# Patient Record
Sex: Male | Born: 1990 | Race: Black or African American | Hispanic: No | Marital: Single | Smoking: Current every day smoker
Health system: Southern US, Community
[De-identification: ages and names within clinical notes are randomized; demographics above are authoritative.]

---

## 2019-04-12 ENCOUNTER — Other Ambulatory Visit: Payer: Self-pay | Admitting: *Deleted

## 2019-04-12 DIAGNOSIS — Z20822 Contact with and (suspected) exposure to covid-19: Secondary | ICD-10-CM

## 2019-04-14 LAB — NOVEL CORONAVIRUS, NAA: SARS-CoV-2, NAA: DETECTED — AB

## 2020-05-19 ENCOUNTER — Emergency Department: Admission: EM | Admit: 2020-05-19 | Discharge: 2020-05-19 | Discharge status: Against Medical Advice | Payer: Self-pay

## 2020-05-19 NOTE — ED Notes (Signed)
No answer when called from lobby 

## 2020-07-21 ENCOUNTER — Encounter: Payer: Self-pay | Admitting: Emergency Medicine

## 2020-07-21 ENCOUNTER — Emergency Department
Admission: EM | Admit: 2020-07-21 | Discharge: 2020-07-21 | Disposition: A | Discharge status: Home / Self Care | Payer: Self-pay | Attending: Emergency Medicine | Admitting: Emergency Medicine

## 2020-07-21 ENCOUNTER — Other Ambulatory Visit: Payer: Self-pay

## 2020-07-21 ENCOUNTER — Emergency Department: Payer: Self-pay

## 2020-07-21 DIAGNOSIS — S6010XA Contusion of unspecified finger with damage to nail, initial encounter: Secondary | ICD-10-CM

## 2020-07-21 DIAGNOSIS — S62660A Nondisplaced fracture of distal phalanx of right index finger, initial encounter for closed fracture: Secondary | ICD-10-CM | POA: Insufficient documentation

## 2020-07-21 DIAGNOSIS — W231XXA Caught, crushed, jammed, or pinched between stationary objects, initial encounter: Secondary | ICD-10-CM | POA: Insufficient documentation

## 2020-07-21 DIAGNOSIS — F17210 Nicotine dependence, cigarettes, uncomplicated: Secondary | ICD-10-CM | POA: Insufficient documentation

## 2020-07-21 DIAGNOSIS — Z23 Encounter for immunization: Secondary | ICD-10-CM | POA: Insufficient documentation

## 2020-07-21 MED ORDER — TRAMADOL HCL 50 MG PO TABS
50.0000 mg | ORAL_TABLET | Freq: Once | ORAL | Status: AC
  Administered 2020-07-21: 50 mg via ORAL
  Filled 2020-07-21: qty 1

## 2020-07-21 MED ORDER — TRAMADOL HCL 50 MG PO TABS: 50.0000 mg | ORAL_TABLET | Freq: Four times a day (QID) | ORAL | 0 refills | Status: AC | PRN

## 2020-07-21 MED ORDER — TETANUS-DIPHTH-ACELL PERTUSSIS 5-2.5-18.5 LF-MCG/0.5 IM SUSY
0.5000 mL | PREFILLED_SYRINGE | Freq: Once | INTRAMUSCULAR | Status: AC
  Administered 2020-07-21: 0.5 mL via INTRAMUSCULAR
  Filled 2020-07-21: qty 0.5

## 2020-07-21 NOTE — Discharge Instructions (Addendum)
Ice and elevate as needed for pain and swelling.  Wear the finger splint for support and protection.  You may take it off to take shower and then replace it.  A prescription of tramadol was sent to your pharmacy.  This medication is for pain.  Do not drive and operate machinery while taking this medication.  Call and make an appointment with Dr. Odis Luster who is the orthopedist on-call for follow-up.

## 2020-07-21 NOTE — ED Provider Notes (Signed)
[facility] Emergency Department Provider Note  ____________________________________________   Event Date/Time   First MD Initiated Contact with Patient 07/21/20 [id number]     (approximate)  I have reviewed the triage vital signs and the nursing notes.   HISTORY  Chief Complaint Finger Injury   HPI Wesley Meza is a 30 y.o. male presents to the ED with injury to his right index finger.  Patient states he got his finger stuck in his truck door last evening.  Patient reports pain and swelling since his incident.  He tried using ice once last evening without relief.  Patient is unaware of the last time he had a tetanus booster.  Currently rates his pain as a 10/10.       History reviewed. No pertinent past medical history.  There are no problems to display for this patient.   History reviewed. No pertinent surgical history.  Prior to Admission medications   Medication Sig Start Date End Date Taking? Authorizing Provider  traMADol (ULTRAM) 50 MG tablet Take 1 tablet (50 mg total) by mouth every 6 (six) hours as needed. 07/21/20  Yes Tommi Rumps, PA-C    Allergies Vicodin hp [hydrocodone-acetaminophen]  No family history on file.  Social History Social History   Tobacco Use  . Smoking status: Current Every Day Smoker    Types: Cigarettes  . Smokeless tobacco: Never Used  Substance Use Topics  . Alcohol use: Yes    Comment: occ  . Drug use: Not Currently    Review of Systems Constitutional: No fever/chills Eyes: No visual changes. Cardiovascular: Denies chest pain. Respiratory: Denies shortness of breath. Musculoskeletal: Positive for right index finger pain. Skin: Abrasion right index finger. Neurological: Negative for headaches, focal weakness or numbness.  ____________________________________________   PHYSICAL EXAM:  VITAL SIGNS: ED Triage Vitals  Enc Vitals Group     BP 07/21/20 0851 (!) 132/92     Pulse Rate 07/21/20  0851 82     Resp 07/21/20 0851 16     Temp 07/21/20 0851 98.1 F (36.7 C)     Temp Source 07/21/20 0851 Oral     SpO2 07/21/20 0851 99 %     Weight 07/21/20 0849 142 lb 13.7 oz (64.8 kg)     Height 07/21/20 0850 6' (1.829 m)     Head Circumference --      Peak Flow --      Pain Score 07/21/20 0850 10     Pain Loc --      Pain Edu? --      Excl. in GC? --     Constitutional: Alert and oriented. Well appearing and in no acute distress. Eyes: Conjunctivae are normal.  Head: Atraumatic. Neck: No stridor.   Cardiovascular: Normal rate, regular rhythm. Grossly normal heart sounds.  Good peripheral circulation. Respiratory: Normal respiratory effort.  No retractions. Lungs CTAB. Musculoskeletal: Examination of the right index finger there is moderate soft tissue edema noted to the distal aspect and markedly tender to palpation.  Nail is intact but a subungual hematoma is present.  Motor sensory function intact.  No deformity noted to the MP or PIP joint. Neurologic:  Normal speech and language. No gross focal neurologic deficits are appreciated.  Skin:  Skin is warm, dry.  Superficial abrasion noted to the volar aspect of the right index finger without foreign body or bleeding. Psychiatric: Mood and affect are normal. Speech and behavior are normal.  ____________________________________________   LABS (all labs ordered  are listed, but only abnormal results are displayed)  Labs Reviewed - No data to display ____________________________________________   RADIOLOGY I, Tommi Rumps, personally viewed and evaluated these images (plain radiographs) as part of my medical decision making, as well as reviewing the written report by the radiologist.    Official radiology report(s): DG Finger Index Right  Result Date: 07/21/2020 CLINICAL DATA:  Crush injury to right second digit, initial encounter EXAM: RIGHT INDEX FINGER 2+V COMPARISON:  None. FINDINGS: Comminuted fracture of the distal  aspect of the second distal phalanx is noted without significant displacement. Mild soft tissue swelling is noted. IMPRESSION: Comminuted fracture of the second distal phalanx. Electronically Signed   By: Alcide Clever M.D.   On: 07/21/2020 09:35    ____________________________________________   PROCEDURES  Procedure(s) performed (including Critical Care):  Procedures Attempt to I&D the subungual hematoma was unsuccessful as patient could not tolerate pressure.  Patient was placed in a splint rn.  ____________________________________________   INITIAL IMPRESSION / ASSESSMENT AND PLAN / ED COURSE  As part of my medical decision making, I reviewed the following data within the electronic MEDICAL RECORD NUMBER Notes from prior ED visits and [place] Controlled Substance Database  30 year old male presents to the ED with complaint of right index finger pain after he slammed his finger in his truck door last evening.  X-ray shows a comminuted fracture of the distal portion of the distal phalanx.  Patient was unable to tolerate I&D of the subungual hematoma.  He was placed in a splint and given tramadol while in the ED.  A prescription for tramadol was sent to his pharmacy.  He is encouraged to ice and elevate as needed for swelling and pain.  He is to follow-up with Dr. Odis Luster who is on-call for orthopedics if any continued problems and also for a follow-up appointment.  ____________________________________________   FINAL CLINICAL IMPRESSION(S) / ED DIAGNOSES  Final diagnoses:  Closed nondisplaced fracture of distal phalanx of right index finger, initial encounter  Subungual hematoma of finger of right hand, initial encounter     ED Discharge Orders         Ordered    traMADol (ULTRAM) 50 MG tablet  Every 6 hours PRN        07/21/20 1054          *Please note:  [name] was evaluated in Emergency Department on 07/21/2020 for the symptoms described in the history of present illness. He  was evaluated in the context of the global COVID-19 pandemic, which necessitated consideration that the patient might be at risk for infection with the SARS-CoV-2 virus that causes COVID-19. Institutional protocols and algorithms that pertain to the evaluation of patients at risk for COVID-19 are in a state of rapid change based on information released by regulatory bodies including the CDC and federal and state organizations. These policies and algorithms were followed during the patient's care in the ED.  Some ED evaluations and interventions may be delayed as a result of limited staffing during and the pandemic.*   Note:  This document was prepared using Dragon voice recognition software and may include unintentional dictation errors.    Tommi Rumps, PA-C 07/21/20 1255    Jene Every, MD 07/21/20 1304

## 2020-07-21 NOTE — ED Notes (Signed)
See triage note  Presents with injury to right index finger  States he shut his finger in truck door last pm

## 2020-07-21 NOTE — ED Triage Notes (Signed)
Slammed right index finger in car door last night.  C/O pain and swelling to digit.

## 2021-01-23 ENCOUNTER — Other Ambulatory Visit: Payer: Self-pay

## 2021-01-23 ENCOUNTER — Ambulatory Visit: Payer: Self-pay | Admitting: Physician Assistant

## 2021-01-23 ENCOUNTER — Encounter: Payer: Self-pay | Admitting: Physician Assistant

## 2021-01-23 DIAGNOSIS — Z113 Encounter for screening for infections with a predominantly sexual mode of transmission: Secondary | ICD-10-CM

## 2021-01-23 LAB — GRAM STAIN

## 2021-01-23 LAB — HM HIV SCREENING LAB: HM HIV Screening: NEGATIVE

## 2021-01-23 NOTE — Progress Notes (Signed)
[facility] Department STI clinic/screening visit  Subjective:  Wesley Meza is a 30 y.o. male being seen today for an STI screening visit. The patient reports they do have symptoms.    Patient has the following medical conditions:  There are no problems to display for this patient.    Chief Complaint  Patient presents with   SEXUALLY TRANSMITTED DISEASE    screening    HPI  Patient reports that he has had itching in the genital area for about 1 week.  Reports that he had some "hair bumps" that had pus and blood in them when they were popped but are healing now.  Denies chronic conditions, surgeries and regular medicines.  Reports last HIV test was earlier this year and last void prior to sample collection for Gram stain was about 2 hr ago.    Screening for MPX risk: Does the patient have an unexplained rash? No Is the patient MSM? No Does the patient endorse multiple sex partners or anonymous sex partners? No Did the patient have close or sexual contact with a person diagnosed with MPX? No Has the patient traveled outside the [place] where MPX is endemic? No Is there a high clinical suspicion for MPX-- evidenced by one of the following No  -Unlikely to be chickenpox  -Lymphadenopathy  -Rash that present in same phase of evolution on any given body part   See flowsheet for further details and programmatic requirements.    The following portions of the patient's history were reviewed and updated as appropriate: allergies, current medications, past medical history, past social history, past surgical history and problem list.  Objective:  There were no vitals filed for this visit.  Physical Exam Constitutional:      General: He is not in acute distress.    Appearance: Normal appearance.  HENT:     Head: Normocephalic and atraumatic.     Comments: No nits,lice, or hair loss. No cervical, supraclavicular or axillary adenopathy.     Mouth/Throat:     Mouth:  Mucous membranes are moist.     Pharynx: Oropharynx is clear. No oropharyngeal exudate or posterior oropharyngeal erythema.  Eyes:     Conjunctiva/sclera: Conjunctivae normal.  Pulmonary:     Effort: Pulmonary effort is normal.  Abdominal:     Palpations: Abdomen is soft. There is no mass.     Tenderness: There is no abdominal tenderness. There is no guarding or rebound.  Genitourinary:    Penis: Normal.      Testes: Normal.     Comments: Pubic area without nits, lice, hair loss, edema, erythema, lesions and inguinal adenopathy.  Left side of pubic area with hyperpigmented, healed area where patient reports that he had "hair bumps". No tenderness, ulceration, edema or erythema. Penis circumcised without rash, lesions and discharge at meatus. Testicles descended bilaterally,nt, no masses or edema.  Musculoskeletal:     Cervical back: Neck supple. No tenderness.  Skin:    General: Skin is warm and dry.     Findings: No bruising, erythema, lesion or rash.  Neurological:     Mental Status: He is alert and oriented to person, place, and time.  Psychiatric:        Mood and Affect: Mood normal.        Behavior: Behavior normal.        Thought Content: Thought content normal.        Judgment: Judgment normal.      Assessment and Plan:  Wesley Natter  Meza is a 30 y.o. male presenting to the [facility] Department for STI screening  1. Screening for STD (sexually transmitted disease) Patient into clinic with symptoms. Reviewed with patient Gram stain results and that no treatment is indicated today. Counseled patient that likely had folliculitis and that it is clearing, but to RTC with any concerns. Rec condoms with all sex. Await test results.  Counseled that RN will call if needs to RTC for treatment once results are back.  - Gram stain - Gonococcus culture - HIV Wesley Meza LAB - Syphilis Serology,  Lab     No follow-ups on file.  No future  appointments.  Matt Holmes, PA

## 2021-01-28 LAB — GONOCOCCUS CULTURE

## 2021-10-09 IMAGING — DX DG FINGER INDEX 2+V*R*
3 series · 3 of 3 positions shown · non-contrast
Comparison: None.

CLINICAL DATA: Crush injury to right second digit, initial
encounter

EXAM:
RIGHT INDEX FINGER 2+V

[finger ap]
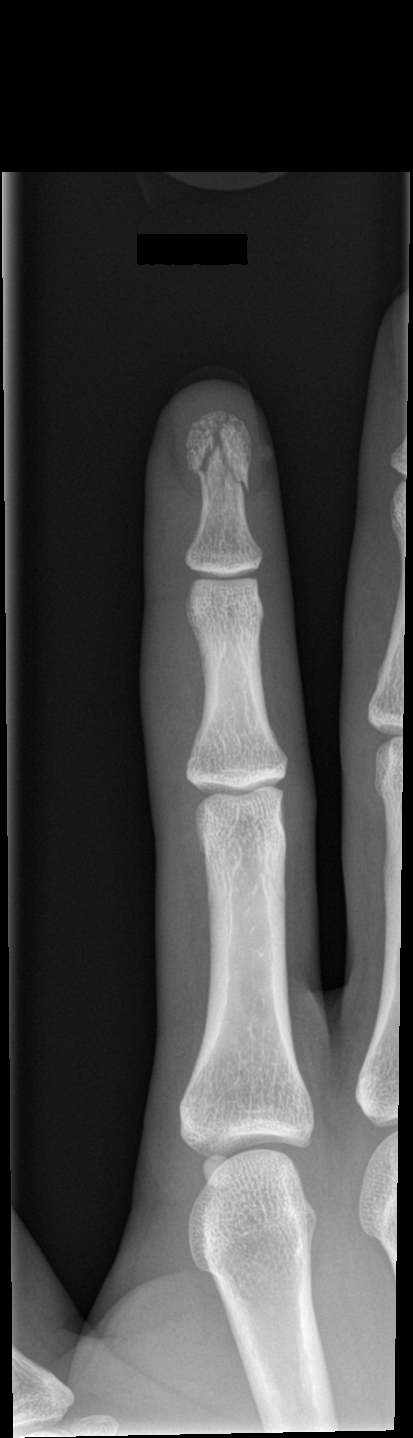

[finger obl]
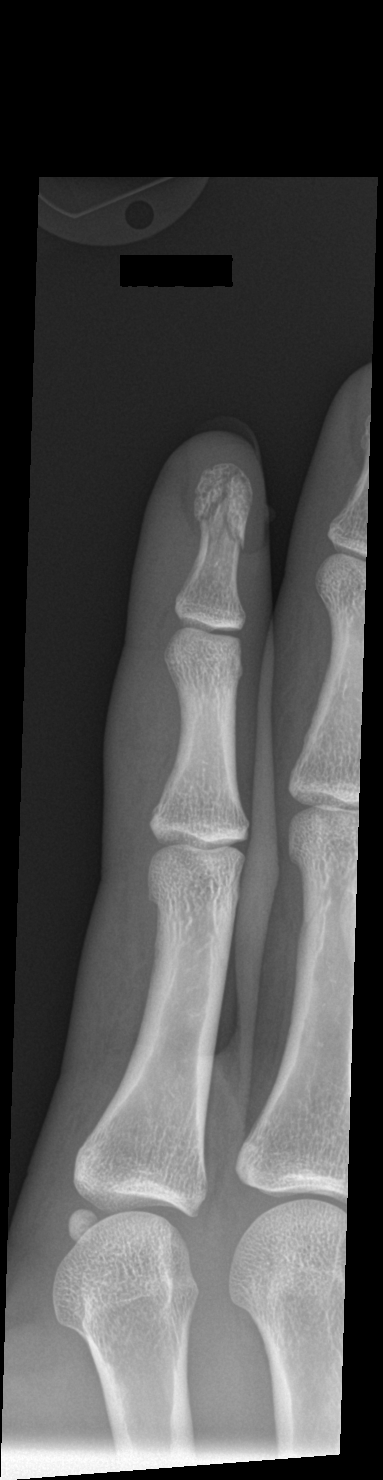

[finger lat]
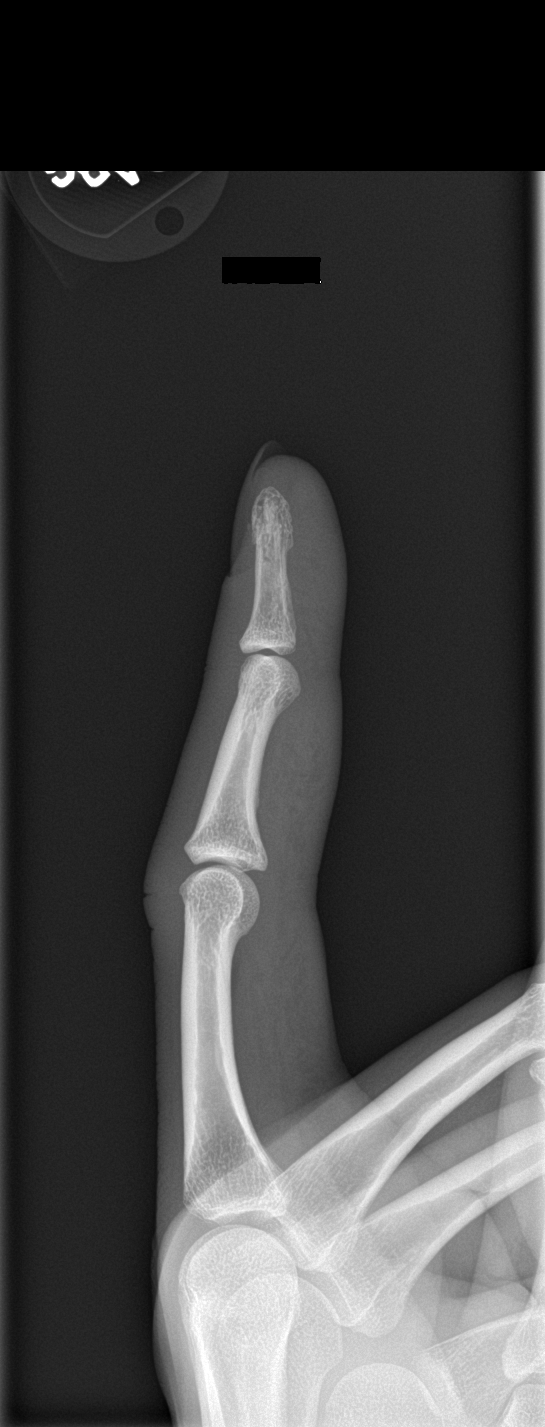

[3 of 3 positions shown; findings below may reference images not displayed]

FINDINGS: Comminuted fracture of the distal aspect of the second distal
phalanx is noted without significant displacement. Mild soft tissue
swelling is noted.
IMPRESSION: Comminuted fracture of the second distal phalanx.
